# Patient Record
Sex: Male | Born: 1990 | Race: White
Health system: Northeastern US, Academic
[De-identification: ages and names within clinical notes are randomized; demographics above are authoritative.]

## PROBLEM LIST (undated history)

## (undated) DIAGNOSIS — I469 Cardiac arrest, cause unspecified: Secondary | ICD-10-CM

## (undated) DIAGNOSIS — I4581 Long QT syndrome: Secondary | ICD-10-CM

## (undated) HISTORY — PX: CARDIAC DEFIBRILLATOR PLACEMENT: SHX171

---

## 2015-08-27 ENCOUNTER — Ambulatory Visit
Admission: AD | Admit: 2015-08-27 | Discharge: 2015-08-27 | Disposition: A | Discharge status: Home / Self Care | Payer: Self-pay | Attending: Emergency Medicine | Admitting: Emergency Medicine

## 2015-08-27 DIAGNOSIS — L03811 Cellulitis of head [any part, except face]: Secondary | ICD-10-CM

## 2015-08-27 HISTORY — DX: Long QT syndrome: I45.81

## 2015-08-27 HISTORY — DX: Cardiac arrest, cause unspecified: I46.9

## 2015-08-27 LAB — HM HIV SCREENING OFFERED

## 2015-08-27 MED ORDER — CLINDAMYCIN HCL 150 MG PO CAPS *I*
150.0000 mg | ORAL_CAPSULE | Freq: Three times a day (TID) | ORAL | 0 refills | Status: AC
Start: 2015-08-27 — End: 2015-09-03

## 2015-08-27 NOTE — Discharge Instructions (Signed)
Take anti-biotic, clindamycin, 1 tablet 3 times a day for 7 days to treat skin infection    Monitor the area closely.  If the area of redness and pain and swelling is spreading despite antibody, go to an emergency department.  Also go to the emergency department if he develops fevers or starts to feel very ill    You can apply ice or cool compresses to the rash to help with swelling, pain and itching     Follow-up with your primary care provider if the rash is not improving in the next 2 days

## 2015-08-27 NOTE — ED Triage Notes (Signed)
pt noticed itching burning rash on top of head on Weds not getting better        Triage Note   [name], RN

## 2015-08-27 NOTE — UC Provider Note (Addendum)
History     Chief Complaint   Patient presents with    Rash     pt noticed itching burning rash on top of head on Weds not getting better      HPI Comments: 24 year old male with medical history of cardiac arrest and long QT syndrome presents to urgent care center with him itching/burning on the top of his head that has been going on for the past 5 days.  The area is painful to touch.  He is not sure if the area has been spreading.  He does not know what caused it in the first place.  No known insect bite or trauma.    He denies fever, chills, malaise, appetite changes, unusual fatigue.  No wheezing, difficulty breathing, SOB, change in voice, swelling of the lips, tongue, or throat, hives, nausea, vomiting or diarrhea.      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 No pertinent family history.      Social History      reports that he has never smoked. He does not have any smokeless tobacco history on file. He reports that he drinks alcohol. He reports that he does not use illicit drugs. His sexual activity history is not on file.    Living Situation     Questions Responses    Patient lives with Family    Homeless No    Caregiver for other family member No    External Services None    Employment Student    Domestic Violence Risk No          Review of Systems   Review of Systems   Constitutional: Negative for appetite change, chills, fatigue and fever.   HENT: Negative for facial swelling, sore throat, trouble swallowing and voice change.    Respiratory: Negative for chest tightness, shortness of breath and wheezing.    Cardiovascular: Negative for chest pain.   Gastrointestinal: Negative for abdominal pain,  diarrhea, nausea and vomiting.   Musculoskeletal: Negative for myalgias.   Skin: Positive for rash.   Neurological: Negative for dizziness.       Physical Exam     ED Triage Vitals   BP Heart Rate Heart Rate (via Pulse Ox) Resp Temp Temp src SpO2 O2 Device O2 Flow Rate   08/27/15 1125 08/27/15 1125 -- 08/27/15 1125 08/27/15 1125 -- 08/27/15 1125 -- --   121/79 76  18 36.7 C (98.1 F)  98 %        Weight           08/27/15 1125           99.8 kg (220 lb)               Physical Exam   Constitutional: He is oriented to person, place, and time. He appears well-developed and well-nourished. No distress.   HENT:   Head: Normocephalic.   Mouth/Throat: Oropharynx is clear and moist.   Pulmonary/Chest: Effort normal and breath sounds normal. No respiratory distress. He has no wheezes.  Neurological: He is alert and oriented to person, place, and time.   Skin: Rash noted. He is not diaphoretic.   There is a circular area of macular erythema on the top of his scalp which is approximate 4 cm in diameter.  This area is warm and tender to palpation.  There are 3 central black scabs.  Currently there are no visible vesicles, papules, scaling.  There is no fluctuance or induration.        Medical Decision Making        Initial Evaluation:  ED First Provider Contact     Date/Time Event User Comments    08/27/15 1130 ED Provider First Contact Lianne Moris Initial Face to Face Provider Contact          Patient seen by me as above    Assessment:  24 y.o., male comes to the Urgent Care Center with burning, mildly itching area on his scalp over the past 5 days.  No symptoms of systemic infection or anaphylaxis.  physical exam reveals an afebrile man in no acute distress with normal vital signs.    Differential Diagnosis includes cellulitis, abscess, zoster, local inflammatory reaction, allergic reaction.  It is possible that the original scab areas were caused by shingles but currently he has some cellulitis surrounding scabs.               Plan:  PE    Final Diagnosis  Cellulitis of scalp      Take anti-biotic, clindamycin, 1 tablet 3 times a day for 7 days to treat skin infection.  Clindamycin was chosen because he developed severe hives with penicillins so I am reluctant to give him cephalexin in case of cross reactivity.  My research indicates that clindamycin does not interfere with long QT syndrome.     Monitor the area closely.  If the area of redness and pain and swelling is spreading despite antibody, go to an emergency department.  Also go to the emergency department if he develops fevers or starts to feel very ill    You can apply ice or cool compresses to the rash to help with swelling, pain and itching     Follow-up with your primary care provider if the rash is not improving in the next 2 days          Konrad Penta, MD       Konrad Penta, MD  08/27/15 1211       Konrad Penta, MD  08/27/15 1213

## 2016-05-14 ENCOUNTER — Ambulatory Visit: Admission: AD | Admit: 2016-05-14 | Payer: Self-pay

## 2016-05-14 ENCOUNTER — Ambulatory Visit
Admission: AD | Admit: 2016-05-14 | Discharge: 2016-05-14 | Disposition: A | Discharge status: Home / Self Care | Payer: Self-pay | Attending: Family Medicine | Admitting: Family Medicine

## 2016-05-14 ENCOUNTER — Other Ambulatory Visit: Payer: Self-pay | Admitting: Family Medicine

## 2020-08-07 ENCOUNTER — Emergency Department (HOSPITAL_COMMUNITY)

## 2020-08-07 ENCOUNTER — Other Ambulatory Visit: Payer: Self-pay

## 2020-08-07 ENCOUNTER — Encounter (HOSPITAL_COMMUNITY): Payer: Self-pay

## 2020-08-07 ENCOUNTER — Emergency Department (HOSPITAL_COMMUNITY)
Admission: EM | Admit: 2020-08-07 | Discharge: 2020-08-08 | Disposition: A | Discharge status: Home / Self Care | Attending: Emergency Medicine | Admitting: Emergency Medicine

## 2020-08-07 DIAGNOSIS — R0602 Shortness of breath: Secondary | ICD-10-CM

## 2020-08-07 DIAGNOSIS — R05 Cough: Secondary | ICD-10-CM | POA: Diagnosis present

## 2020-08-07 DIAGNOSIS — J1282 Pneumonia due to coronavirus disease 2019: Secondary | ICD-10-CM | POA: Insufficient documentation

## 2020-08-07 DIAGNOSIS — U071 COVID-19: Secondary | ICD-10-CM | POA: Insufficient documentation

## 2020-08-07 DIAGNOSIS — R197 Diarrhea, unspecified: Secondary | ICD-10-CM | POA: Diagnosis not present

## 2020-08-07 HISTORY — DX: Cardiac arrest, cause unspecified: I46.9

## 2020-08-07 NOTE — ED Triage Notes (Signed)
Pt arrives with c/o cough and shob after testing positive for covid on 08/06/20 at a [facility] in [place].

## 2020-08-08 ENCOUNTER — Emergency Department (HOSPITAL_COMMUNITY)
Admission: EM | Admit: 2020-08-08 | Discharge: 2020-08-08 | Disposition: A | Discharge status: Home / Self Care | Source: Home / Self Care | Attending: Emergency Medicine | Admitting: Emergency Medicine

## 2020-08-08 ENCOUNTER — Ambulatory Visit (HOSPITAL_COMMUNITY)
Admit: 2020-08-08 | Discharge: 2020-08-08 | Disposition: A | Discharge status: Home / Self Care | Attending: Pulmonary Disease | Admitting: Pulmonary Disease

## 2020-08-08 ENCOUNTER — Other Ambulatory Visit: Payer: Self-pay | Admitting: Adult Health

## 2020-08-08 ENCOUNTER — Other Ambulatory Visit: Payer: Self-pay

## 2020-08-08 ENCOUNTER — Encounter (HOSPITAL_COMMUNITY): Payer: Self-pay | Admitting: Emergency Medicine

## 2020-08-08 DIAGNOSIS — U071 COVID-19: Secondary | ICD-10-CM

## 2020-08-08 DIAGNOSIS — J1282 Pneumonia due to Coronavirus disease 2019: Secondary | ICD-10-CM | POA: Insufficient documentation

## 2020-08-08 DIAGNOSIS — R042 Hemoptysis: Secondary | ICD-10-CM | POA: Insufficient documentation

## 2020-08-08 DIAGNOSIS — R197 Diarrhea, unspecified: Secondary | ICD-10-CM | POA: Insufficient documentation

## 2020-08-08 LAB — SARS CORONAVIRUS 2 BY RT PCR (HOSPITAL ORDER, PERFORMED IN ~~LOC~~ HOSPITAL LAB): SARS Coronavirus 2: POSITIVE — AB

## 2020-08-08 MED ORDER — EPINEPHRINE 0.3 MG/0.3ML IJ SOAJ: 0.3000 mg | Freq: Once | INTRAMUSCULAR | Status: DC | PRN

## 2020-08-08 MED ORDER — FAMOTIDINE IN NACL 20-0.9 MG/50ML-% IV SOLN: 20.0000 mg | Freq: Once | INTRAVENOUS | Status: DC | PRN

## 2020-08-08 MED ORDER — SODIUM CHLORIDE 0.9 % IV SOLN
1200.0000 mg | Freq: Once | INTRAVENOUS | Status: AC
  Administered 2020-08-08: 1200 mg via INTRAVENOUS
  Filled 2020-08-08: qty 10

## 2020-08-08 MED ORDER — METHYLPREDNISOLONE SODIUM SUCC 125 MG IJ SOLR: 125.0000 mg | Freq: Once | INTRAMUSCULAR | Status: DC | PRN

## 2020-08-08 MED ORDER — ALBUTEROL SULFATE HFA 108 (90 BASE) MCG/ACT IN AERS: 2.0000 | INHALATION_SPRAY | Freq: Once | RESPIRATORY_TRACT | Status: DC | PRN

## 2020-08-08 MED ORDER — DIPHENHYDRAMINE HCL 50 MG/ML IJ SOLN: 50.0000 mg | Freq: Once | INTRAMUSCULAR | Status: DC | PRN

## 2020-08-08 MED ORDER — SODIUM CHLORIDE 0.9 % IV SOLN: INTRAVENOUS | Status: DC | PRN

## 2020-08-08 NOTE — ED Provider Notes (Addendum)
[facility]-EMERGENCY DEPT Provider Note   CSN: [id number] Arrival date & time: 08/08/20  1638     History Chief Complaint  Patient presents with  . covid positive  . Hemoptysis    Mitchell Caldwell is a 29 y.o. male.  Patient with onset of symptoms consistent with Covid infection on Friday.  Fevers cough fatigue diarrhea.  Patient try to get seen down through the emergency department yesterday but wait was too long.  So he went home came back today.  Since that time he started to cough up some phlegm with streaks of blood.  No pooling of blood.  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  Patient states that the pacemaker is not normally engaged.  Patient has not had any the vaccines.        Past Medical History:  Diagnosis Date  . Cardiac arrest (HCC)     There are no problems to display for this patient.   Past Surgical History:  Procedure Laterality Date  . CARDIAC DEFIBRILLATOR PLACEMENT         No family history on file.  Social History   Tobacco Use  . Smoking status: Not on file  Substance Use Topics  . Alcohol use: Not on file  . Drug use: Not on file    Home Medications Prior to Admission medications   Not on File    Allergies    Penicillins  Review of Systems   Review of Systems  Constitutional: Positive for fatigue and fever. Negative for chills.  HENT: Negative for congestion, rhinorrhea and sore throat.   Eyes: Negative for visual disturbance.  Respiratory: Positive for cough. Negative for shortness of breath.   Cardiovascular: Negative for chest pain and leg swelling.  Gastrointestinal: Positive for diarrhea. Negative for abdominal pain, blood in stool, nausea and vomiting.  Genitourinary: Negative for dysuria.  Musculoskeletal: Negative for back pain and neck pain.  Skin: Negative for rash.  Neurological:  Negative for dizziness, light-headedness and headaches.  Hematological: Does not bruise/bleed easily.  Psychiatric/Behavioral: Negative for confusion.    Physical Exam Updated Vital Signs BP 120/78   Pulse 94   Temp 99.5 F (37.5 C) (Oral)   Resp 19   SpO2 98%   Physical Exam Vitals and nursing note reviewed.  Constitutional:      General: He is not in acute distress.    Appearance: Normal appearance. He is well-developed.  HENT:     Head: Normocephalic and atraumatic.  Eyes:     Extraocular Movements: Extraocular movements intact.     Conjunctiva/sclera: Conjunctivae normal.     Pupils: Pupils are equal, round, and reactive to light.  Cardiovascular:     Rate and Rhythm: Normal rate and regular rhythm.     Heart sounds: No murmur heard.   Pulmonary:     Effort: Pulmonary effort is normal. No respiratory distress.     Breath sounds: Normal breath sounds. No wheezing.  Abdominal:     Palpations: Abdomen is soft.     Tenderness: There is no abdominal tenderness.  Musculoskeletal:        General: No swelling. Normal range of motion.     Cervical back: Neck supple.  Skin:    General: Skin is warm and dry.     Capillary Refill: Capillary refill takes less than 2 seconds.  Neurological:     General:  No focal deficit present.     Mental Status: He is alert and oriented to person, place, and time.     Cranial Nerves: No cranial nerve deficit.     Sensory: No sensory deficit.     Motor: No weakness.     ED Results / Procedures / Treatments   Labs (all labs ordered are listed, but only abnormal results are displayed) Labs Reviewed - No data to display  EKG EKG Interpretation  Date/Time:  Tuesday August 08 2020 11:19:32 EDT Ventricular Rate:  80 PR Interval:    QRS Duration: 93 QT Interval:  353 QTC Calculation: 408 R Axis:   70 Text Interpretation: Sinus rhythm Borderline short PR interval Borderline T abnormalities, inferior leads Non-specific ST-t changes  Confirmed by Raeford Razor [phone]) on 08/08/2020 11:24:17 AM   Radiology DG Chest Port 1 View  Result Date: 08/07/2020 CLINICAL DATA:  Cough and shortness of breath.  COVID positive. EXAM: PORTABLE CHEST 1 VIEW COMPARISON:  None. FINDINGS: Single lead pacemaker in place. Upper normal heart size normal mediastinal contours. Patchy bilateral airspace opacities in both lung bases. No pulmonary edema, pleural effusion or pneumothorax. No acute osseous abnormalities are seen. IMPRESSION: Patchy bilateral lung base opacities most consistent with multifocal pneumonia, pattern typical of COVID-19. Electronically Signed   By: Narda Rutherford M.D.   On: 08/07/2020 22:35    Procedures Procedures (including critical care time)  Medications Ordered in ED Medications - No data to display  ED Course  I have reviewed the triage vital signs and the nursing notes.  Pertinent labs & imaging results that were available during my care of the patient were reviewed by me and considered in my medical decision making (see chart for details).    MDM Rules/Calculators/A&P                          Patient's EKG shows a heart rate of 80 sinus rhythm.  Has a short PR borderline T waves inferior leads.  Patient denies any chest pain.  His corrected QT is 408 on the EKG.  Also with good rhythm on cardiac monitoring.  No hypoxia.  Patient's oxygen levels in the upper 90s.  On room air.  Contacted the infusion center to see if he was a candidate for monoclonal antibody infusion based on his BMI and his heart condition he is.  They have room to do him in the infusion center.  Patient will be discharged from here and moved to there.  Patient given precautions and guidelines on isolation and when to return for what worse symptoms.  Work note provided.  Final Clinical Impression(s) / ED Diagnoses Final diagnoses:  Pneumonia due to COVID-19 virus    Rx / DC Orders ED Discharge Orders    None       Vanetta Mulders, MD 08/08/20 1142      Vanetta Mulders, MD 08/08/20 1146

## 2020-08-08 NOTE — ED Triage Notes (Signed)
Pt came in last night but left before being seen. Covid came back positive. Pt coughing and reports when he is coughing this morning orange stuff came up. C/o body aches esp chest with cough.

## 2020-08-08 NOTE — Discharge Instructions (Signed)
You are a candidate for the monoclonal antibody.  The nurse will escort you down to the infusion center.  He will be infused with antibiotic today.  Work note provided to be out of work.  Return for any worsening or increasing shortness of breath.

## 2020-08-08 NOTE — ED Notes (Signed)
Attempted to call [facility] to give report, unable to reach anyone

## 2020-08-08 NOTE — Progress Notes (Signed)
  Diagnosis: COVID-19  Physician: Dr. Shan Levans  Procedure: Cov[facility] Infusion Clinic Med: casirivimab\imdevimab infusion - Provided patient with casirivimab\imdevimab fact sheet for patients, parents and caregivers prior to infusion.  Complications: No immediate complications noted.  Discharge: Discharged home   Essie Hart 08/08/2020

## 2020-08-08 NOTE — Discharge Instructions (Addendum)

## 2020-08-08 NOTE — Progress Notes (Signed)
I connected by phone with Mitchell Caldwell on 08/08/2020 at 11:42 AM to discuss the potential use of a new treatment for mild to moderate COVID-19 viral infection in non-hospitalized patients.  This patient is a 29 y.o. male that meets the FDA criteria for Emergency Use Authorization of COVID monoclonal antibody casirivimab/imdevimab.  Has a (+) direct SARS-CoV-2 viral test result  Has mild or moderate COVID-19   Is NOT hospitalized due to COVID-19  Is within 10 days of symptom onset  Has at least one of the high risk factor(s) for progression to severe COVID-19 and/or hospitalization as defined in EUA.  Specific high risk criteria : Cardiovascular disease or hypertension   I have spoken and communicated the following to the patient or parent/caregiver regarding COVID monoclonal antibody treatment:  1. FDA has authorized the emergency use for the treatment of mild to moderate COVID-19 in adults and pediatric patients with positive results of direct SARS-CoV-2 viral testing who are 60 years of age and older weighing at least 40 kg, and who are at high risk for progressing to severe COVID-19 and/or hospitalization.  2. The significant known and potential risks and benefits of COVID monoclonal antibody, and the extent to which such potential risks and benefits are unknown.  3. Information on available alternative treatments and the risks and benefits of those alternatives, including clinical trials.  4. Patients treated with COVID monoclonal antibody should continue to self-isolate and use infection control measures (e.g., wear mask, isolate, social distance, avoid sharing personal items, clean and disinfect "high touch" surfaces, and frequent handwashing) according to CDC guidelines.   5. The patient or parent/caregiver has the option to accept or refuse COVID monoclonal antibody treatment.  After reviewing this information with the patient, The patient agreed to proceed with receiving  casirivimab\imdevimab infusion and will be provided a copy of the Fact sheet prior to receiving the infusion. [name] 08/08/2020 11:42 AM

## 2020-08-08 NOTE — ED Notes (Signed)
Eloped from waiting area.  °

## 2021-04-17 IMAGING — DX DG CHEST 1V PORT
1 series · 1 of 1 positions shown · non-contrast
Comparison: None.

CLINICAL DATA: Cough and shortness of breath.  COVID positive.

EXAM:
PORTABLE CHEST 1 VIEW

[chest ap]
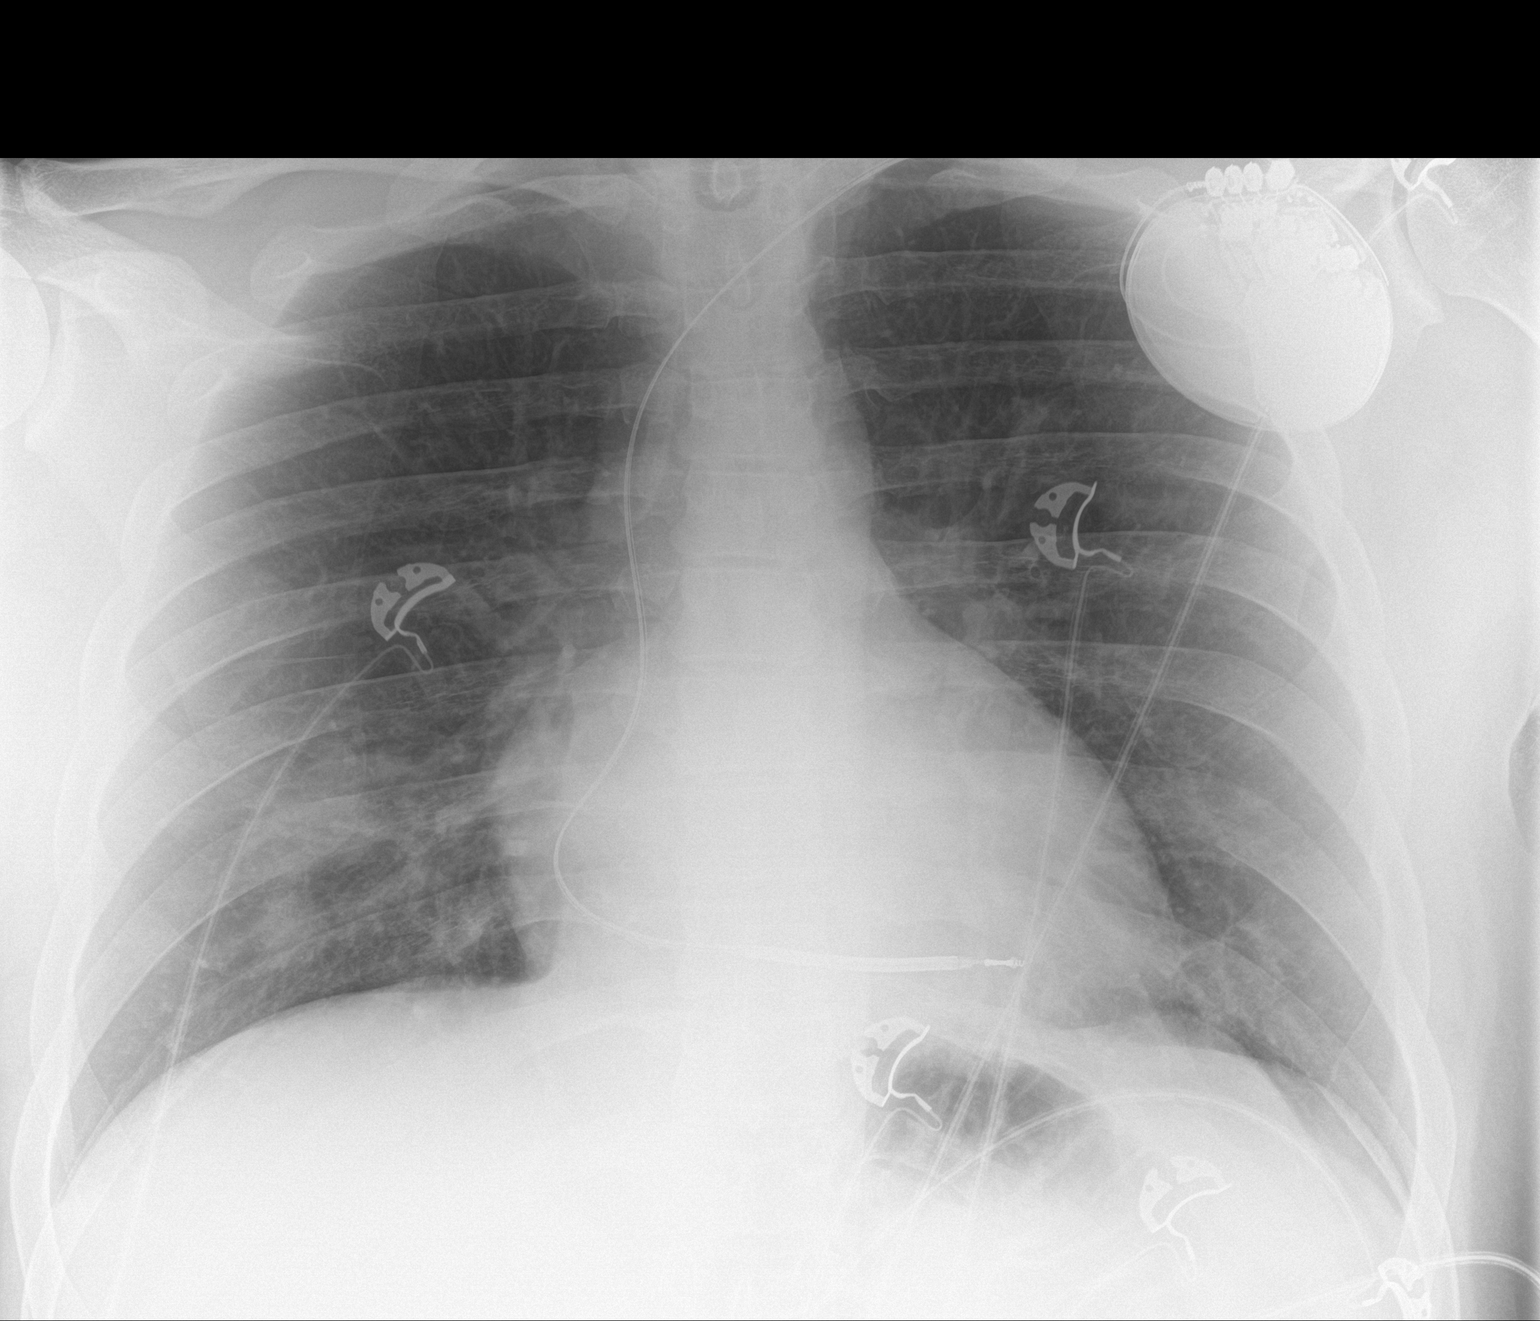

[1 of 1 positions shown; findings below may reference images not displayed]

FINDINGS: Single lead pacemaker in place. Upper normal heart size normal
mediastinal contours. Patchy bilateral airspace opacities in both
lung bases. No pulmonary edema, pleural effusion or pneumothorax. No
acute osseous abnormalities are seen.
IMPRESSION: Patchy bilateral lung base opacities most consistent with multifocal
pneumonia, pattern typical of [id number].
